# Patient Record
Sex: Male | Born: 1980 | Race: Black or African American | Hispanic: No | Marital: Single | Smoking: Current every day smoker
Health system: Southern US, Community
[De-identification: ages and names within clinical notes are randomized; demographics above are authoritative.]

---

## 2000-09-21 ENCOUNTER — Encounter: Payer: Self-pay | Admitting: Emergency Medicine

## 2000-09-21 ENCOUNTER — Emergency Department (HOSPITAL_COMMUNITY): Admission: EM | Admit: 2000-09-21 | Discharge: 2000-09-21 | Payer: Self-pay | Admitting: Emergency Medicine

## 2000-09-21 ENCOUNTER — Encounter: Payer: Self-pay | Admitting: Specialist

## 2000-10-15 ENCOUNTER — Encounter: Payer: Self-pay | Admitting: Specialist

## 2000-10-15 ENCOUNTER — Ambulatory Visit (HOSPITAL_COMMUNITY): Admission: RE | Admit: 2000-10-15 | Discharge: 2000-10-15 | Payer: Self-pay | Admitting: Specialist

## 2004-10-02 ENCOUNTER — Emergency Department (HOSPITAL_COMMUNITY): Admission: EM | Admit: 2004-10-02 | Discharge: 2004-10-03 | Payer: Self-pay | Admitting: Emergency Medicine

## 2004-12-01 ENCOUNTER — Emergency Department (HOSPITAL_COMMUNITY): Admission: EM | Admit: 2004-12-01 | Discharge: 2004-12-01 | Payer: Self-pay | Admitting: Emergency Medicine

## 2018-03-26 ENCOUNTER — Encounter: Payer: Self-pay | Admitting: Emergency Medicine

## 2018-03-26 ENCOUNTER — Emergency Department
Admission: EM | Admit: 2018-03-26 | Discharge: 2018-03-26 | Disposition: A | Discharge status: Home / Self Care | Payer: Self-pay | Attending: Emergency Medicine | Admitting: Emergency Medicine

## 2018-03-26 ENCOUNTER — Other Ambulatory Visit: Payer: Self-pay

## 2018-03-26 DIAGNOSIS — F17210 Nicotine dependence, cigarettes, uncomplicated: Secondary | ICD-10-CM | POA: Insufficient documentation

## 2018-03-26 DIAGNOSIS — K029 Dental caries, unspecified: Secondary | ICD-10-CM | POA: Insufficient documentation

## 2018-03-26 MED ORDER — LIDOCAINE VISCOUS HCL 2 % MT SOLN
15.0000 mL | Freq: Once | OROMUCOSAL | Status: AC
  Administered 2018-03-26: 15 mL via OROMUCOSAL
  Filled 2018-03-26: qty 15

## 2018-03-26 MED ORDER — TRAMADOL HCL 50 MG PO TABS: 50.0000 mg | ORAL_TABLET | Freq: Two times a day (BID) | ORAL | 0 refills | Status: AC | PRN

## 2018-03-26 MED ORDER — IBUPROFEN 800 MG PO TABS: 800.0000 mg | ORAL_TABLET | Freq: Three times a day (TID) | ORAL | 0 refills | Status: AC | PRN

## 2018-03-26 MED ORDER — AMOXICILLIN 500 MG PO CAPS: 500.0000 mg | ORAL_CAPSULE | Freq: Three times a day (TID) | ORAL | 0 refills | Status: AC

## 2018-03-26 MED ORDER — IBUPROFEN 800 MG PO TABS
800.0000 mg | ORAL_TABLET | Freq: Once | ORAL | Status: AC
Start: 2018-03-26 — End: 2018-03-26
  Administered 2018-03-26: 800 mg via ORAL
  Filled 2018-03-26: qty 1

## 2018-03-26 NOTE — Discharge Instructions (Addendum)
Follow-up from list of dental clinics provided. °OPTIONS FOR DENTAL FOLLOW UP CARE ° °[place] Department of Health and Human Services - Local Safety Net Dental Clinics °http://www.ncdhhs.gov/dph/oralhealth/services/safetynetclinics.htm °  °[facility] (336-562-3123) ° °[facility] [place] (919-933-9087) ° °[facility] (919-663-1744 ext [phone]) ° °[facility] (336-570-6415) ° °[facility] ([phone]) °This clinic caters to the indigent population and is on a lottery system. °Location: °[facility], [address], [place] °Clinic Hours: °Wednesdays from 6pm - 9pm, patients seen by a lottery system. °For dates, call or go to www.med.[facility].edu/[facility]/patients/Dental-[facility] °Services: °Cleanings, fillings and simple extractions. °Payment Options: °DENTAL WORK IS FREE OF CHARGE. Bring proof of income or support. °Best way to get seen: °Arrive at 5:15 pm - this is a lottery, NOT first come/first serve, so arriving earlier will not increase your chances of being seen. °  °  °[facility] °919-537-3737 °Select option 1 for emergencies °  °Location: °[facility], [address], [place] °Clinic Hours: °No walk-ins accepted - call the day before to schedule an appointment. °Check in times are 9:30 am and 1:30 pm. °Services: °Simple extractions, temporary fillings, pulpectomy/pulp debridement, uncomplicated abscess drainage. °Payment Options: °PAYMENT IS DUE AT THE TIME OF SERVICE.  Fee is usually $100-200, additional surgical procedures (e.g. abscess drainage) may be extra. °Cash, checks, Visa/MasterCard accepted.  Can file Medicaid if patient is covered for dental - patient should call case worker to check. °No discount for [facility] patients. °Best way to get seen: °MUST call the day before and get onto the schedule. Can usually be seen the next 1-2 days. No walk-ins accepted. °  °   °[facility] °[phone] °  °Location: °[facility], [address], [place] °Clinic Hours: °M, W, Th, F 8am or 1:30pm, Tues 9a or 1:30 - first come/first served. °Services: °Simple extractions, temporary fillings, uncomplicated abscess drainage.  You do not need to be an [place] resident. °Payment Options: °PAYMENT IS DUE AT THE TIME OF SERVICE. °Dental insurance, otherwise sliding scale - bring proof of income or support. °Depending on income and treatment needed, cost is usually $50-200. °Best way to get seen: °Arrive early as it is first come/first served. °  °  °[facility] °[phone] °  °Location: °[address] °Clinic Hours: °Mon-Thu 8a-5p °Services: °Most basic dental services including extractions and fillings. °Payment Options: °PAYMENT IS DUE AT THE TIME OF SERVICE. °Sliding scale, up to 50% off - bring proof if income or support. °Medicaid with dental option accepted. °Best way to get seen: °Call to schedule an appointment, can usually be seen within 2 weeks OR they will try to see walk-ins - show up at 8a or 2p (you may have to wait). °  °  °[facility] °919-245-2435 °[place] RESIDENTS ONLY °  °Location: °[facility], [address], [place], [place] [address] °Clinic Hours: By appointment only. °Monday - Thursday 8am-5pm, Friday 8am-12pm °Services: Cleanings, fillings, extractions. °Payment Options: °PAYMENT IS DUE AT THE TIME OF SERVICE. °Cash, Visa or MasterCard. Sliding scale - $30 minimum per service. °Best way to get seen: °Come in to office, complete packet and make an appointment - need proof of income °or support monies for each household member and proof of [place] residence. °Usually takes about a month to get in. °  °  °[facility] °  [phone] °  °Location: °[address] [place] °Clinic Hours: Walk-in Urgent Care  Dental Services are offered Monday-Friday mornings only. °The numbers of emergencies accepted daily is limited to the number of °providers available. °Maximum 15 - Mondays, Wednesdays & Thursdays °Maximum 10 - Tuesdays & Fridays °Services: °You do not need to be a [place] resident to be seen for a dental emergency. °Emergencies are defined as pain, swelling, abnormal bleeding, or dental trauma. Walkins will receive x-rays if needed. °NOTE: Dental cleaning is not an emergency. °Payment Options: °PAYMENT IS DUE AT THE TIME OF SERVICE. °Minimum co-pay is $40.00 for uninsured patients. °Minimum co-pay is $3.00 for Medicaid with dental coverage. °Dental Insurance is accepted and must be presented at time of visit. °Medicare does not cover dental. °Forms of payment: Cash, credit card, checks. °Best way to get seen: °If not previously registered with the clinic, walk-in dental registration begins at 7:15 am and is on a first come/first serve basis. °If previously registered with the clinic, call to make an appointment. °  °  °[facility] °919-776-4359 °[place] RESIDENTS ONLY °  °Location: °[address], [place],  °Clinic Hours: °Mon-Thu 10a-2p °Services: Extractions only! °Payment Options: °FREE (donations accepted) - bring proof of income or support °Best way to get seen: °Call and schedule an appointment OR come at 8am on the 1st Monday of every month (except for holidays) when it is first come/first served. °  °  °[facility] °[phone] °  °Location: °[address], [place] °Clinic Hours: °Friday mornings °Services, Payment Options, Best way to get seen: °Call for info ° °

## 2018-03-26 NOTE — ED Provider Notes (Signed)
[facility] Emergency Department Provider Note   ____________________________________________   First MD Initiated Contact with Patient 03/26/18 1020     (approximate)  I have reviewed the triage vital signs and the nursing notes.   HISTORY  Chief Complaint No chief complaint on file.    HPI Tyler Riley is a 37 y.o. male patient presents with right elbow dental pain for over a month.  Patient said he has a broken tooth that is gotten progressively worse.  Patient pain radiates from to to to his right ear.  Patient denies fever associated with this complaint.  Patient can tolerate food and fluids.  Patient rates the pain as a 10/10.  Patient described the pain is "aching".  No palliative measure for complaint.  History reviewed. No pertinent past medical history.  There are no active problems to display for this patient.     Prior to Admission medications   Medication Sig Start Date End Date Taking? Authorizing Provider  amoxicillin (AMOXIL) 500 MG capsule Take 1 capsule (500 mg total) by mouth 3 (three) times daily. 03/26/18   Joni ReiningSmith, Ronald K, PA-C  ibuprofen (ADVIL,MOTRIN) 800 MG tablet Take 1 tablet (800 mg total) by mouth every 8 (eight) hours as needed for moderate pain. 03/26/18   Joni ReiningSmith, Ronald K, PA-C  traMADol (ULTRAM) 50 MG tablet Take 1 tablet (50 mg total) by mouth every 12 (twelve) hours as needed. 03/26/18   Joni ReiningSmith, Ronald K, PA-C    Allergies Patient has no known allergies.  No family history on file.  Social History Social History   Tobacco Use  . Smoking status: Current Every Day Smoker  . Smokeless tobacco: Never Used  Substance Use Topics  . Alcohol use: Not on file  . Drug use: Not on file    Review of Systems Constitutional: No fever/chills Eyes: No visual changes. ENT: No sore throat.  Dental and ear pain. Cardiovascular: Denies chest pain. Respiratory: Denies shortness of breath. Gastrointestinal: No abdominal  pain.  No nausea, no vomiting.  No diarrhea.  No constipation. Genitourinary: Negative for dysuria. Musculoskeletal: Negative for back pain. Skin: Negative for rash. Neurological: Negative for headaches, focal weakness or numbness.   ____________________________________________   PHYSICAL EXAM:  VITAL SIGNS: ED Triage Vitals  Enc Vitals Group     BP      Pulse      Resp      Temp      Temp src      SpO2      Weight      Height      Head Circumference      Peak Flow      Pain Score      Pain Loc      Pain Edu?      Excl. in GC?    Constitutional: Alert and oriented. Well appearing and in no acute distress. Eyes: Conjunctivae are normal. PERRL. EOMI. Head: Atraumatic. Nose: No congestion/rhinnorhea. Mouth/Throat: Mucous membranes are moist.  Oropharynx non-erythematous.  Multiple caries with gingivitis. Neck: No stridor.  Cardiovascular: Normal rate, regular rhythm. Grossly normal heart sounds.  Good peripheral circulation. Respiratory: Normal respiratory effort.  No retractions. Lungs CTAB.  ____________________________________________   LABS (all labs ordered are listed, but only abnormal results are displayed)  Labs Reviewed - No data to display ____________________________________________  EKG   ____________________________________________  RADIOLOGY  ED MD interpretation:    Official radiology report(s): No results found.  ____________________________________________   PROCEDURES  Procedure(s) performed: None  Procedures  Critical Care performed: No  ____________________________________________   INITIAL IMPRESSION / ASSESSMENT AND PLAN / ED COURSE  As part of my medical decision making, I reviewed the following data within the electronic MEDICAL RECORD NUMBER    Dental caries secondary to multiple caries of the right upper molar.  Patient given discharge care instruction advised take medication as directed.  Patient advised to follow-up  with dental clinic from list provided.     ____________________________________________   FINAL CLINICAL IMPRESSION(S) / ED DIAGNOSES  Final diagnoses:  Pain due to dental caries     ED Discharge Orders         Ordered    amoxicillin (AMOXIL) 500 MG capsule  3 times daily     03/26/18 1029    ibuprofen (ADVIL,MOTRIN) 800 MG tablet  Every 8 hours PRN     03/26/18 1029    traMADol (ULTRAM) 50 MG tablet  Every 12 hours PRN     03/26/18 1029           Note:  This document was prepared using Dragon voice recognition software and may include unintentional dictation errors.    Joni Reining, PA-C 03/26/18 1034    Charlynne Pander, MD 03/26/18 [phone]

## 2018-03-26 NOTE — ED Triage Notes (Signed)
Presents with right ear pain and right sided dental pain    States he has a broken tooth on the right   Pain to right side increases when he opens his mouth

## 2018-03-30 ENCOUNTER — Emergency Department
Admission: EM | Admit: 2018-03-30 | Discharge: 2018-03-31 | Disposition: A | Discharge status: Home / Self Care | Payer: Self-pay | Attending: Emergency Medicine | Admitting: Emergency Medicine

## 2018-03-30 ENCOUNTER — Emergency Department: Payer: Self-pay

## 2018-03-30 ENCOUNTER — Other Ambulatory Visit: Payer: Self-pay

## 2018-03-30 DIAGNOSIS — K047 Periapical abscess without sinus: Secondary | ICD-10-CM | POA: Insufficient documentation

## 2018-03-30 DIAGNOSIS — R6883 Chills (without fever): Secondary | ICD-10-CM | POA: Insufficient documentation

## 2018-03-30 DIAGNOSIS — Z79899 Other long term (current) drug therapy: Secondary | ICD-10-CM | POA: Insufficient documentation

## 2018-03-30 DIAGNOSIS — F17210 Nicotine dependence, cigarettes, uncomplicated: Secondary | ICD-10-CM | POA: Insufficient documentation

## 2018-03-30 LAB — CBC WITH DIFFERENTIAL/PLATELET
Basophils Absolute: 0 10*3/uL (ref 0–0.1)
Basophils Relative: 0 %
Eosinophils Absolute: 0 10*3/uL (ref 0–0.7)
Eosinophils Relative: 0 %
HCT: 36.4 % — ABNORMAL LOW (ref 40.0–52.0)
Hemoglobin: 13 g/dL (ref 13.0–18.0)
Lymphocytes Relative: 7 %
Lymphs Abs: 1.6 10*3/uL (ref 1.0–3.6)
MCH: 32.5 pg (ref 26.0–34.0)
MCHC: 35.8 g/dL (ref 32.0–36.0)
MCV: 90.9 fL (ref 80.0–100.0)
Monocytes Absolute: 2.4 10*3/uL — ABNORMAL HIGH (ref 0.2–1.0)
Monocytes Relative: 11 %
Neutro Abs: 18.2 10*3/uL — ABNORMAL HIGH (ref 1.4–6.5)
Neutrophils Relative %: 82 %
Platelets: 393 10*3/uL (ref 150–440)
RBC: 4 MIL/uL — ABNORMAL LOW (ref 4.40–5.90)
RDW: 13.4 % (ref 11.5–14.5)
WBC: 22.2 10*3/uL — ABNORMAL HIGH (ref 3.8–10.6)

## 2018-03-30 LAB — COMPREHENSIVE METABOLIC PANEL
ALT: 22 U/L (ref 0–44)
AST: 15 U/L (ref 15–41)
Albumin: 3 g/dL — ABNORMAL LOW (ref 3.5–5.0)
Alkaline Phosphatase: 60 U/L (ref 38–126)
Anion gap: 15 (ref 5–15)
BUN: 7 mg/dL (ref 6–20)
CO2: 29 mmol/L (ref 22–32)
Calcium: 8.2 mg/dL — ABNORMAL LOW (ref 8.9–10.3)
Chloride: 88 mmol/L — ABNORMAL LOW (ref 98–111)
Creatinine, Ser: 0.57 mg/dL — ABNORMAL LOW (ref 0.61–1.24)
GFR calc Af Amer: >60 mL/min (ref >60)
GFR calc non Af Amer: >60 mL/min (ref >60)
Glucose, Bld: 105 mg/dL — ABNORMAL HIGH (ref 70–99)
Potassium: 2.7 mmol/L — CL (ref 3.5–5.1)
Sodium: 132 mmol/L — ABNORMAL LOW (ref 135–145)
Total Bilirubin: 1.5 mg/dL — ABNORMAL HIGH (ref 0.3–1.2)
Total Protein: 7 g/dL (ref 6.5–8.1)

## 2018-03-30 MED ORDER — IOHEXOL 300 MG/ML  SOLN
75.0000 mL | Freq: Once | INTRAMUSCULAR | Status: AC | PRN
  Administered 2018-03-30: 75 mL via INTRAVENOUS
  Filled 2018-03-30: qty 75

## 2018-03-30 MED ORDER — SODIUM CHLORIDE 0.9 % IV SOLN
3.0000 g | Freq: Once | INTRAVENOUS | Status: AC
  Administered 2018-03-30: 3 g via INTRAVENOUS
  Filled 2018-03-30: qty 3

## 2018-03-30 NOTE — ED Triage Notes (Addendum)
Reports he was here Wednesday and was given a prescription for an abscess on the right side of the mouth. Meds not helping. Reports he cannot open his mouth all the way. Pt reports he has tried "every kind of ibuprofen and aleve there is" and "they won't let me work like this".

## 2018-03-30 NOTE — ED Provider Notes (Signed)
[facility] Emergency Department Provider Note  ____________________________________________  Time seen: Approximately 11:39 PM  I have reviewed the triage vital signs and the nursing notes.   HISTORY  Chief Complaint Abscess    HPI Tyler Riley is a 37 y.o. male with no significant past medical history, presents to the emergency department with right-sided facial pain for the past 7 days.   Patient was seen on 03/26/2018 and was diagnosed with a dental abscess and was treated empirically with amoxicillin.  Patient reports that he has been taking amoxicillin 3 times daily as directed and reports that symptoms seem to be worsening instead of improving.  Patient has had chills at work.  Patient has worsenign trismus, which is keeping him from eating normally. Patient is speaking in complete sentences in exam room. Patient has felt fatigued and is sleeping more than usual.  Patient has never experienced similar symptoms in the past.  He denies chest pain, chest tightness, shortness of breath or pain underneath the tongue.  No prior admissions in the past.    History reviewed. No pertinent past medical history.  There are no active problems to display for this patient.   History reviewed. No pertinent surgical history.  Prior to Admission medications   Medication Sig Start Date End Date Taking? Authorizing Provider  amoxicillin (AMOXIL) 500 MG capsule Take 1 capsule (500 mg total) by mouth 3 (three) times daily. 03/26/18   Joni Reining, PA-C  ibuprofen (ADVIL,MOTRIN) 800 MG tablet Take 1 tablet (800 mg total) by mouth every 8 (eight) hours as needed for moderate pain. 03/26/18   Joni Reining, PA-C  traMADol (ULTRAM) 50 MG tablet Take 1 tablet (50 mg total) by mouth every 12 (twelve) hours as needed. 03/26/18   Joni Reining, PA-C    Allergies Patient has no known allergies.  History reviewed. No pertinent family history.  Social History Social History    Tobacco Use  . Smoking status: Current Every Day Smoker  . Smokeless tobacco: Never Used  Substance Use Topics  . Alcohol use: Not on file  . Drug use: Not on file     Review of Systems  Constitutional: Patient has low grade fever.  Eyes: No visual changes. No discharge ENT: Patient has right sided facial pain.  Cardiovascular: no chest pain. Respiratory: no cough. No SOB. Gastrointestinal: No abdominal pain.  No nausea, no vomiting.  No diarrhea.  No constipation. Genitourinary: Negative for dysuria. No hematuria Musculoskeletal: Negative for musculoskeletal pain. Skin: Negative for rash, abrasions, lacerations, ecchymosis. Neurological: Negative for headaches, focal weakness or numbness. .  ____________________________________________   PHYSICAL EXAM:  VITAL SIGNS: ED Triage Vitals  Enc Vitals Group     BP 03/30/18 2012 127/75     Pulse Rate 03/30/18 2012 71     Resp 03/30/18 2012 18     Temp 03/30/18 2012 98.2 F (36.8 C)     Temp Source 03/30/18 2337 Oral     SpO2 03/30/18 2012 97 %     Weight 03/30/18 2013 129 lb 13.6 oz (58.9 kg)     Height 03/30/18 2013 5\' 5"  (1.651 m)     Head Circumference --      Peak Flow --      Pain Score 03/30/18 2013 10     Pain Loc --      Pain Edu? --      Excl. in GC? --      Constitutional: Alert and oriented. Well  appearing and in no acute distress. Eyes: Conjunctivae are normal. PERRL. EOMI. Head: Atraumatic.  Patient has significant right-sided preauricular facial swelling with associated trismus. ENT:      Ears: TMs are pearly.      Nose: No congestion/rhinnorhea.      Mouth/Throat: Mucous membranes are moist.  Patient has no pain underneath the tongue with palpation. Some dental carries visualized.  Neck: No stridor.  No cervical spine tenderness to palpation. Hematological/Lymphatic/Immunilogical: Palpable cervical lymphadenopathy.  Cardiovascular: Normal rate, regular rhythm. Normal S1 and S2.  Good peripheral  circulation. Respiratory: Normal respiratory effort without tachypnea or retractions. Lungs CTAB. Good air entry to the bases with no decreased or absent breath sounds. Musculoskeletal: Full range of motion to all extremities. No gross deformities appreciated. Neurologic:  Normal speech and language. No gross focal neurologic deficits are appreciated.  Skin:  Skin is warm, dry and intact. No rash noted. Psychiatric: Patient seems fatigued.   ____________________________________________   LABS (all labs ordered are listed, but only abnormal results are displayed)  Labs Reviewed  CBC WITH DIFFERENTIAL/PLATELET - Abnormal; Notable for the following components:      Result Value   WBC 22.2 (*)    RBC 4.00 (*)    HCT 36.4 (*)    Neutro Abs 18.2 (*)    Monocytes Absolute 2.4 (*)    All other components within normal limits  COMPREHENSIVE METABOLIC PANEL - Abnormal; Notable for the following components:   Sodium 132 (*)    Potassium 2.7 (*)    Chloride 88 (*)    Glucose, Bld 105 (*)    Creatinine, Ser 0.57 (*)    Calcium 8.2 (*)    Albumin 3.0 (*)    Total Bilirubin 1.5 (*)    All other components within normal limits  CULTURE, BLOOD (ROUTINE X 2)  CULTURE, BLOOD (ROUTINE X 2)  LACTIC ACID, PLASMA  LACTIC ACID, PLASMA   ____________________________________________  EKG   ____________________________________________  RADIOLOGY I personally viewed and evaluated these images as part of my medical decision making, as well as reviewing the written report by the radiologist.    Ct Maxillofacial W Contrast  Result Date: 03/30/2018 CLINICAL DATA:  RIGHT mouth abscess not improved on medication. EXAM: CT MAXILLOFACIAL WITH CONTRAST TECHNIQUE: Multidetector CT imaging of the maxillofacial structures was performed with intravenous contrast. Multiplanar CT image reconstructions were also generated. CONTRAST:  75mL OMNIPAQUE IOHEXOL 300 MG/ML  SOLN COMPARISON:  None. FINDINGS: OSSEOUS:  Approximate tooth 2 periapical abscess with additional smaller periapical abscesses and scattered dental caries. Tooth approximate 32 periapical abscess. Calcified stylohyoid ligament. ORBITS: Ocular globes and orbital contents are normal. SINUSES: Paranasal sinuses are well aerated. Intact nasal septum is midline. Mastoid aircells are well aerated. SOFT TISSUES: 2.6 x 5.3 x 6.8 cm (transverse by AP by cc) fluid collection surrounds the RIGHT angle and ramus of the mandible. Enlarged RIGHT masseter muscle, hyperemic. Enlarged edematous RIGHT pterygoid muscles. Collection extends to the temporomandibular joint and may be intra-articular. No subcutaneous gas. Edema and effusion extending to RIGHT pharynx. Mild reactive RIGHT submandibular sialoadenitis. LIMITED INTRACRANIAL: Normal. IMPRESSION: 1. 2.6 x 5.3 x 6.8 cm RIGHT perimandibular odontogenic abscess, approximate tooth 32 periapical abscess. 2. RIGHT facial and RIGHT neck transspatial edema. RIGHT muscle of mastication myositis. 3. Abscess extends to the RIGHT TMJ, early septic arthritis possible. 4. Poor dentition. Electronically Signed   By: Awilda Metro M.D.   On: 03/30/2018 23:47    ____________________________________________    PROCEDURES  Procedure(s)  performed:    Procedures    Medications  Ampicillin-Sulbactam (UNASYN) 3 g in sodium chloride 0.9 % 100 mL IVPB (0 g Intravenous Stopped 03/30/18 2251)  iohexol (OMNIPAQUE) 300 MG/ML solution 75 mL (75 mLs Intravenous Contrast Given 03/30/18 2320)     ____________________________________________   INITIAL IMPRESSION / ASSESSMENT AND PLAN / ED COURSE  Pertinent labs & imaging results that were available during my care of the patient were reviewed by me and considered in my medical decision making (see chart for details).  Review of the [place] CSRS was performed in accordance of the [facility] prior to dispensing any controlled drugs.      Assessment and Plan: Perimandibular  abscess TMJ septic arthritis Differential diagnosis originally included septic arthritis, dental abscess and osteomyelitis.  CMP was concerning for hyponatremia and hypokalemia in emergency department.  EKG revealed normal sinus rhythm without T wave abnormalities.  CBC indicates leukocytosis at 22.2.  CT maxillofacial indicates perimandibular abscess that extends into right TMJ joint, concerning for early septic arthritis.  Dr. Daphane Shepherd, hospitalist on-call, was consulted and agrees that patient needs admission and IV antibiotics but would like patient to be evaluated by specialist.  Patient's case was discussed with Dr. Minna Antis who assumed patient care as flex transitioned to closing.    ____________________________________________  FINAL CLINICAL IMPRESSION(S) / ED DIAGNOSES  Final diagnoses:  None      NEW MEDICATIONS STARTED DURING THIS VISIT:  ED Discharge Orders    None          This chart was dictated using voice recognition software/Dragon. Despite best efforts to proofread, errors can occur which can change the meaning. Any change was purely unintentional.    Orvil Feil, PA-C 03/31/18 Earl Lagos, MD 03/31/18 [phone]

## 2018-03-30 NOTE — ED Notes (Signed)
Rt sided facial swelling

## 2018-03-30 NOTE — ED Provider Notes (Signed)
ED ECG REPORT I, Tyler Riley, the attending physician, personally viewed and interpreted this ECG.  Date: 03/30/2018  Rhythm: normal sinus rhythm QRS Axis: normal Intervals: normal ST/T Wave abnormalities: normal Narrative Interpretation: no evidence of acute ischemia    Tyler Every, MD 03/30/18 2313

## 2018-03-30 NOTE — ED Notes (Signed)
Potassium level given to  jacqulyn pa

## 2018-03-30 NOTE — ED Notes (Signed)
To Ct Scan

## 2018-03-31 LAB — LACTIC ACID, PLASMA: Lactic Acid, Venous: 1.1 mmol/L (ref 0.5–1.9)

## 2018-03-31 MED ORDER — ONDANSETRON HCL 4 MG/2ML IJ SOLN
4.0000 mg | Freq: Once | INTRAMUSCULAR | Status: AC
  Administered 2018-03-31: 4 mg via INTRAVENOUS
  Filled 2018-03-31: qty 2

## 2018-03-31 MED ORDER — MORPHINE SULFATE (PF) 4 MG/ML IV SOLN
4.0000 mg | Freq: Once | INTRAVENOUS | Status: AC
  Administered 2018-03-31: 4 mg via INTRAVENOUS
  Filled 2018-03-31: qty 1

## 2018-03-31 MED ORDER — POTASSIUM CHLORIDE 10 MEQ/100ML IV SOLN
10.0000 meq | Freq: Once | INTRAVENOUS | Status: AC
  Administered 2018-03-31: 10 meq via INTRAVENOUS
  Filled 2018-03-31: qty 100

## 2018-03-31 MED ORDER — SODIUM CHLORIDE 0.9 % IV BOLUS
1000.0000 mL | Freq: Once | INTRAVENOUS | Status: AC
  Administered 2018-03-31: 1000 mL via INTRAVENOUS

## 2018-03-31 NOTE — Discharge Instructions (Addendum)
Please arrive at 8 AM to [facility] department of oral maxillofacial surgery.  Follow signs for the [facility], proceed to the first floor to check-in.  He will be seen by Dr. Theodoro Kalata.  Please do not eat or drink anything until you are seen by Dr. Theodoro Kalata.  Return to the emergency department for any worsening pain any trouble breathing, or any other symptom personally concerning to yourself.  Address: [address] [place], [place] [address]

## 2018-03-31 NOTE — ED Provider Notes (Signed)
-----------------------------------------   12:41 AM on 03/31/2018 -----------------------------------------  Patient care assumed from physician assistant.  Patient has been experiencing several weeks of right sided dental pain, for the past 10 days he has been experiencing facial pain and swelling as well was seen in the emergency department 7 days ago was treated with amoxicillin.  He states since going home the pain has continued to worsen the swelling has continued to worsen.  Subjective fever at home, 99.3 in the emergency department, vitals are otherwise reassuring.  Patient does have a significant leukocytosis of 22,000.  Labs also show dehydration as well as a potassium of 2.7.  CT scan shows a very large right-sided perimandibular abscess with possible extension into the TMJ.  On examination patient has a widely patent airway, he does have tenderness of the TMJ and right-sided mandible, no tenderness overlying the gums or teeth.  We will replete with IV potassium, we will IV hydrate.  I discussed the patient with Dr. Willeen Cass of ENT, he states this the abscess appears to extend into the right TMJ he recommends OMFS care.  I discussed the patient with Dr. Theodoro Kalata of [facility] oral maxillofacial surgery.  As the patient is otherwise stable he believes the patient is safe for discharge home and follow-up in the clinic at 8 AM at [facility].  Where the patient will undergo an operation and drainage.  Patient is not to eat or drink anything from now until the time he is seen.  We will treat with pain medication in the emergency department, replete potassium, treat with IV Unasyn, dose IV fluids and discharge the patient to follow-up at 8 AM at [facility] in the OMFS clinic which is in the school of dentistry on the first floor.  I discussed this plan of care with the patient he is agreeable to this plan of care.   Minna Antis, MD 03/31/18 [phone]

## 2018-03-31 NOTE — ED Notes (Signed)
Report  Phoned to Tyler Riley  Pt to be moved to room 3  Major side

## 2020-05-01 IMAGING — CT CT MAXILLOFACIAL W/ CM
3 of 5 series · 15 of 47 positions shown, 18 images · IV contrast (omnipaque)
Comparison: None.

CLINICAL DATA: RIGHT mouth abscess not improved on medication.

EXAM:
CT MAXILLOFACIAL WITH CONTRAST
TECHNIQUE: Multidetector CT imaging of the maxillofacial structures was
performed with intravenous contrast. Multiplanar CT image
reconstructions were also generated.
CONTRAST:  75mL OMNIPAQUE IOHEXOL 300 MG/ML  SOLN

[Series 2: max soft · axial · 0.33mm/px · z∈[-250,-124]mm · 9 of 75 slices shown, 12 images]
[im 6/75  brain]
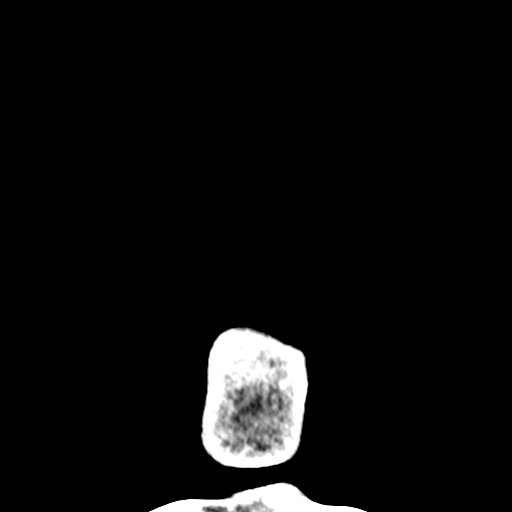
[im 6/75  bone]
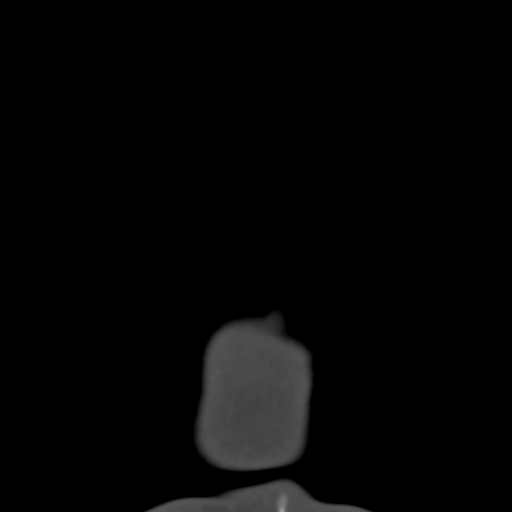
[im 16/75  bone]
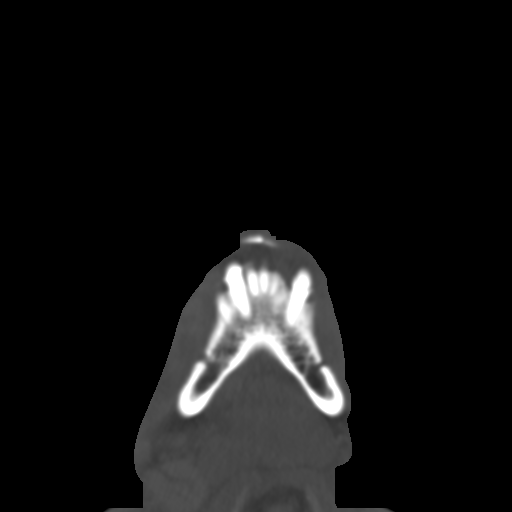
[im 22/75  bone]
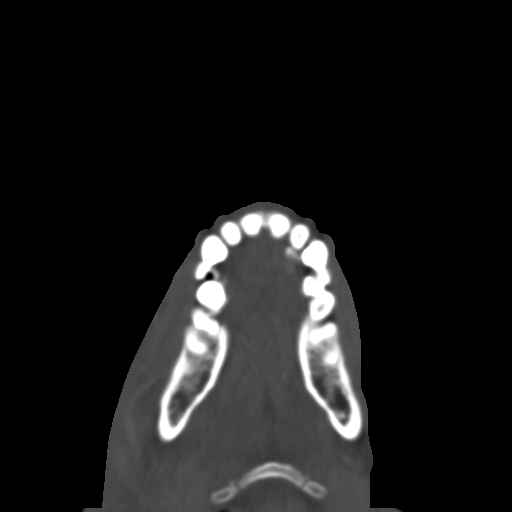
[im 32/75  bone]
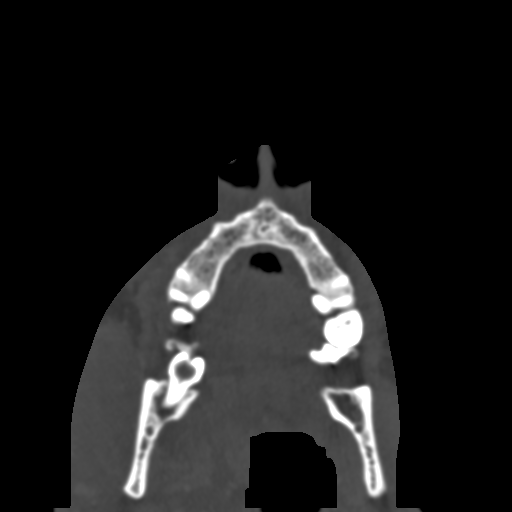
[im 38/75  brain]
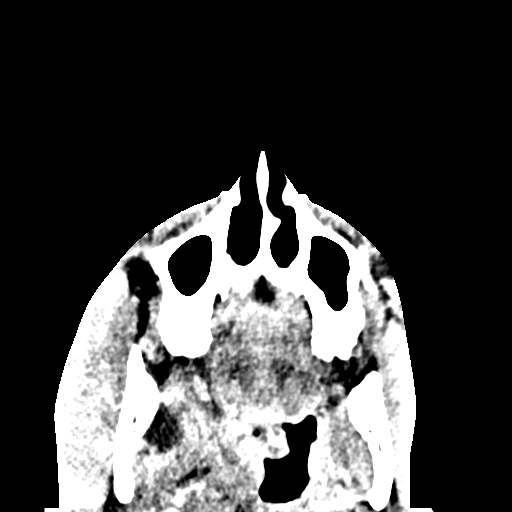
[im 38/75  bone]
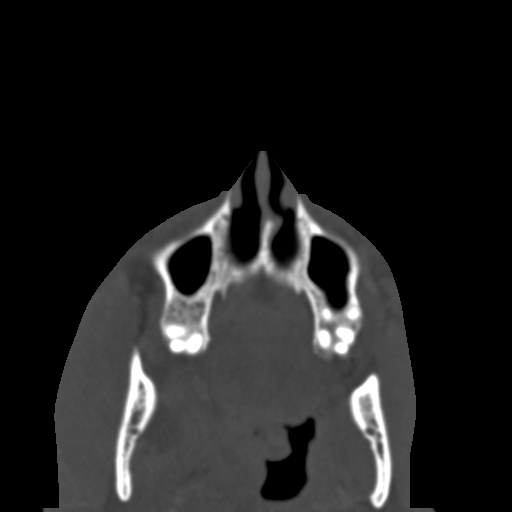
[im 43/75  bone]
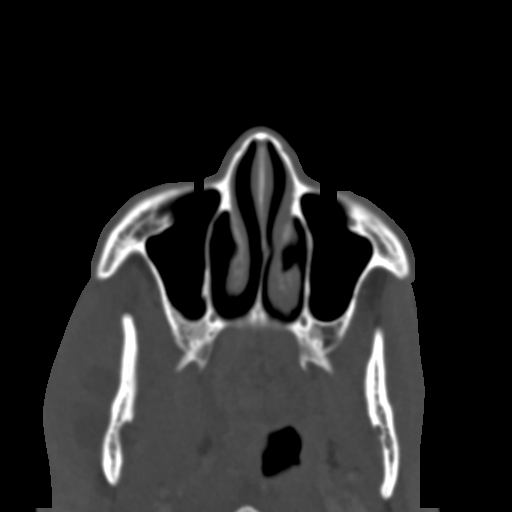
[im 53/75  bone]
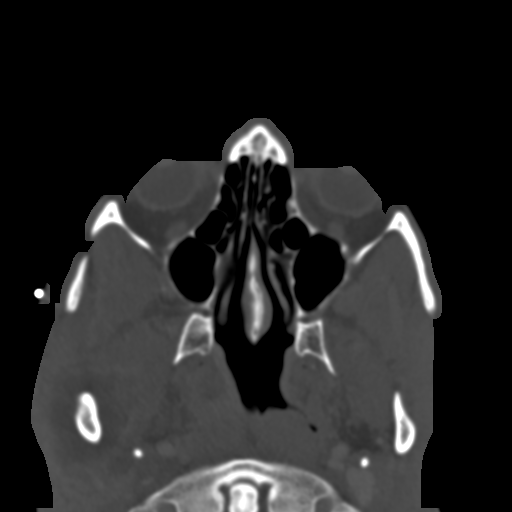
[im 59/75  bone]
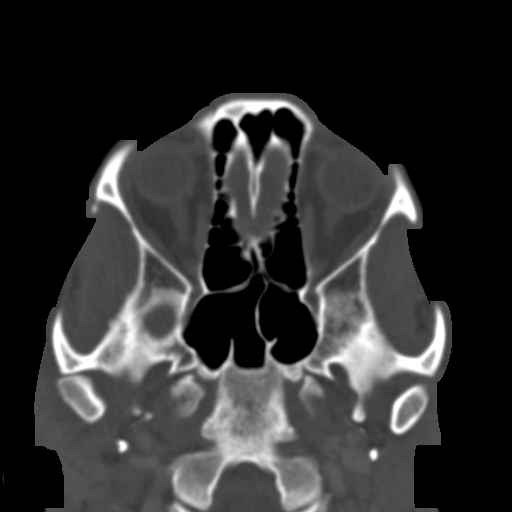
[im 69/75  brain]
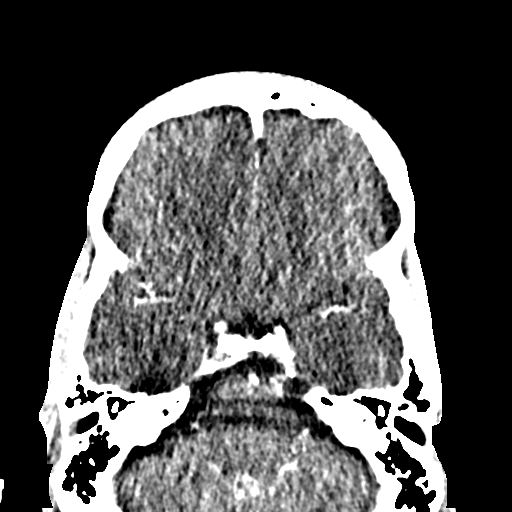
[im 69/75  bone]
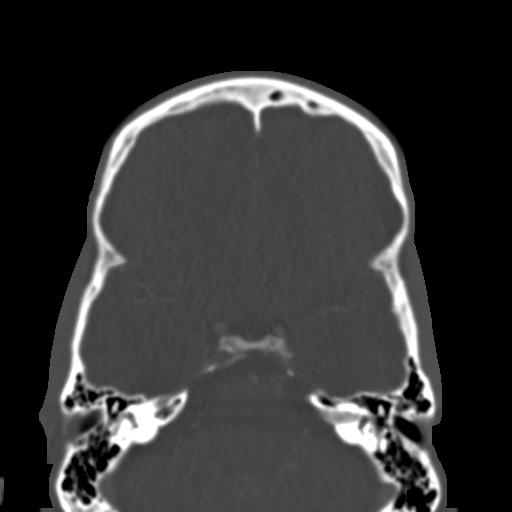

[Series 4: coronal soft · coronal · 0.34mm/px · 3 of 81 slices shown]
[im 27/81  bone]
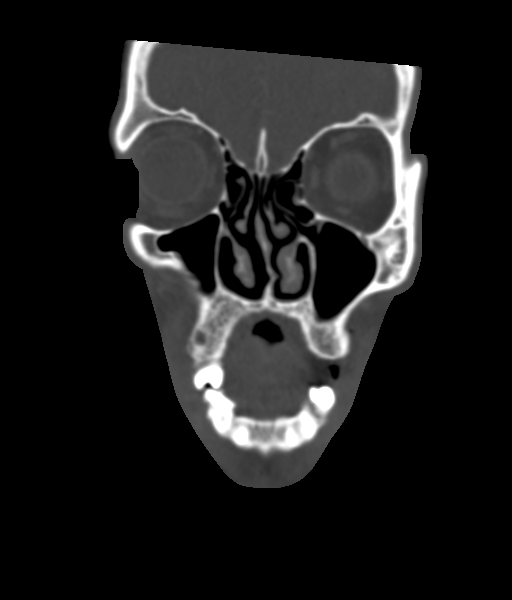
[im 36/81  bone]
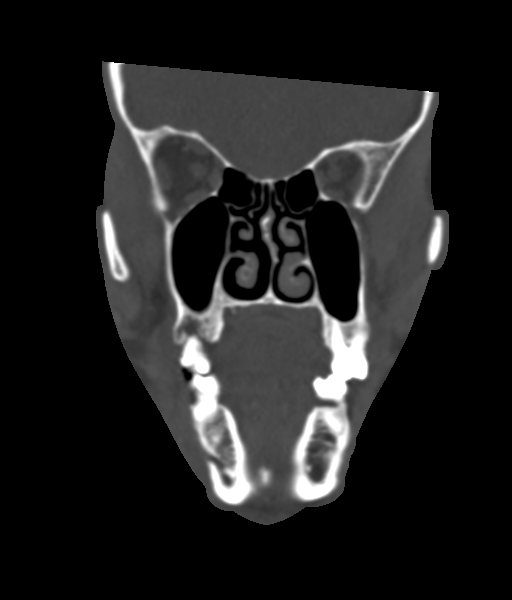
[im 45/81  bone]
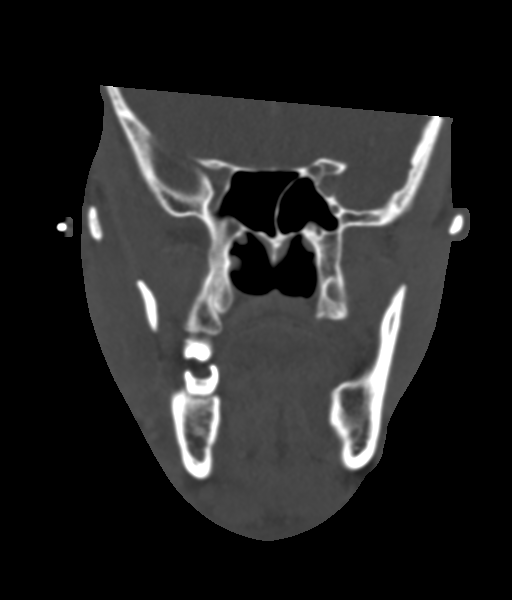

[Series 7: sagittal bone · sagittal · 0.31mm/px · 3 of 88 slices shown]
[im 29/88  bone]
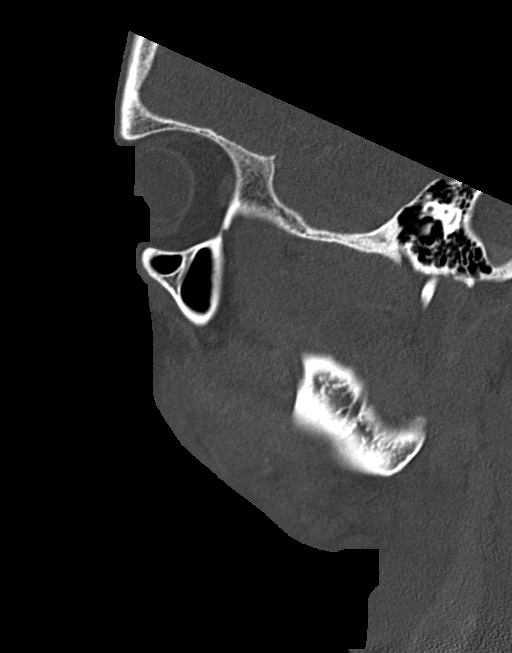
[im 57/88  bone]
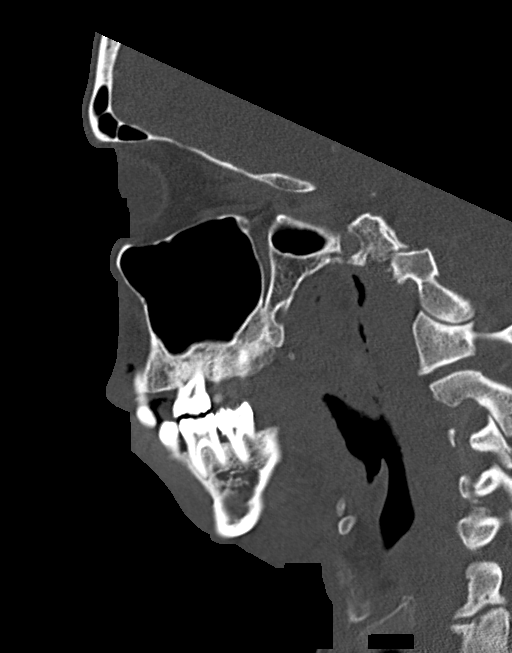
[im 86/88  bone]
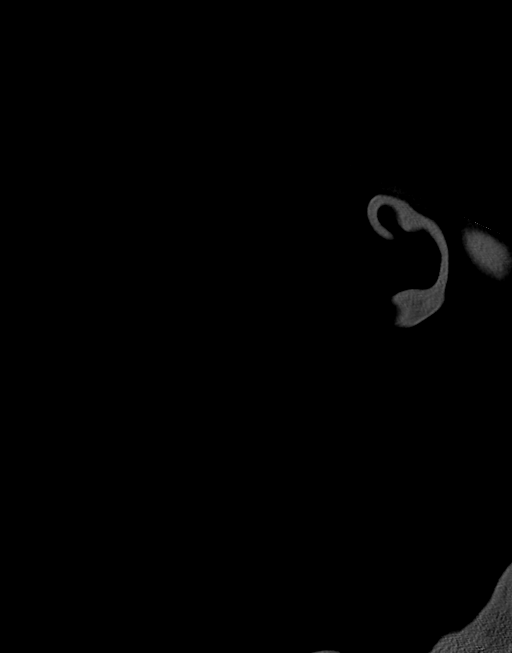

[15 of 47 positions shown; findings below may reference images not displayed]

FINDINGS: OSSEOUS: Approximate tooth 2 periapical abscess with additional
smaller periapical abscesses and scattered dental caries. Tooth
approximate 32 periapical abscess. Calcified stylohyoid ligament.

ORBITS: Ocular globes and orbital contents are normal.

SINUSES: Paranasal sinuses are well aerated. Intact nasal septum is
midline. Mastoid aircells are well aerated.

SOFT TISSUES: 2.6 x 5.3 x 6.8 cm (transverse by AP by cc) fluid
collection surrounds the RIGHT angle and ramus of the mandible.
Enlarged RIGHT masseter muscle, hyperemic. Enlarged edematous RIGHT
pterygoid muscles. Collection extends to the temporomandibular joint
and may be intra-articular. No subcutaneous gas. Edema and effusion
extending to RIGHT pharynx. Mild reactive RIGHT submandibular
sialoadenitis.

LIMITED INTRACRANIAL: Normal.
IMPRESSION: 1. 2.6 x 5.3 x 6.8 cm RIGHT perimandibular odontogenic abscess,
approximate tooth 32 periapical abscess.
2. RIGHT facial and RIGHT neck transspatial edema. RIGHT muscle of
mastication myositis.
3. Abscess extends to the RIGHT TMJ, early septic arthritis
possible.
4. Poor dentition.
